# Patient Record
Sex: Male | Born: 1960 | Race: White | Hispanic: No | Marital: Married
Health system: Southern US, Community
[De-identification: ages and names within clinical notes are randomized; demographics above are authoritative.]

## PROBLEM LIST (undated history)

## (undated) DIAGNOSIS — E785 Hyperlipidemia, unspecified: Secondary | ICD-10-CM

## (undated) DIAGNOSIS — N400 Enlarged prostate without lower urinary tract symptoms: Secondary | ICD-10-CM

## (undated) HISTORY — PX: CHOLECYSTECTOMY: SHX55

---

## 2010-03-18 ENCOUNTER — Ambulatory Visit: Payer: Self-pay | Admitting: Surgery

## 2010-03-25 ENCOUNTER — Ambulatory Visit: Payer: Self-pay | Admitting: Surgery

## 2013-07-03 ENCOUNTER — Ambulatory Visit: Payer: Self-pay | Admitting: Internal Medicine

## 2014-02-26 IMAGING — US ABDOMEN ULTRASOUND LIMITED
1 series · 14 of 25 positions shown · non-contrast
Comparison: none

REASON FOR EXAM: CALL REPORT PAGER [phone] DR JOHANNES PAULUS AFTER 5PM PAGE DR
MARTINI [phone] AT...
COMMENTS:

PROCEDURE:     US  - US ABDOMEN LIMITED SURVEY  - July 03, 2013  [DATE]
RESULT:     Right upper quadrant abdominal ultrasound dated 07/03/2013

[Series 1: abdomen ultrasound limited · 0.31mm/px · 14 of 26 slices shown]
[im 1/26]
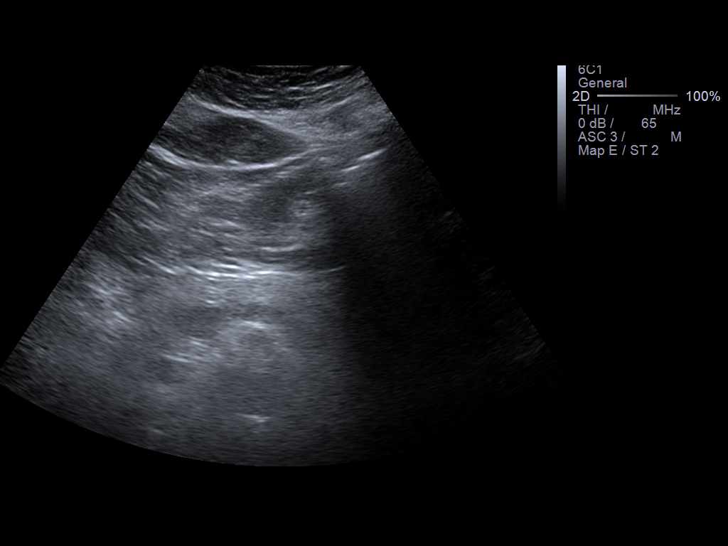
[im 3/26]
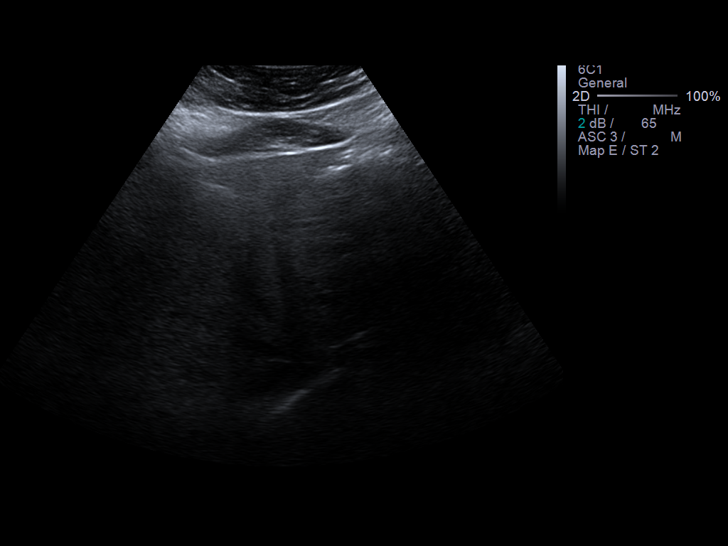
[im 5/26]
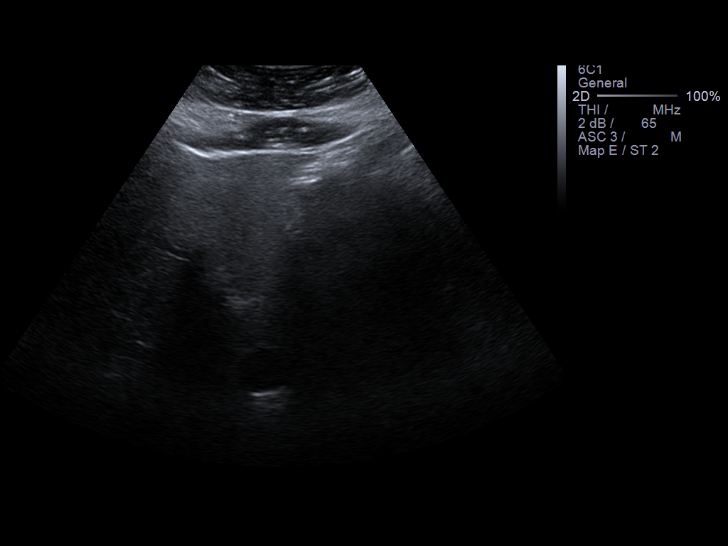
[im 7/26]
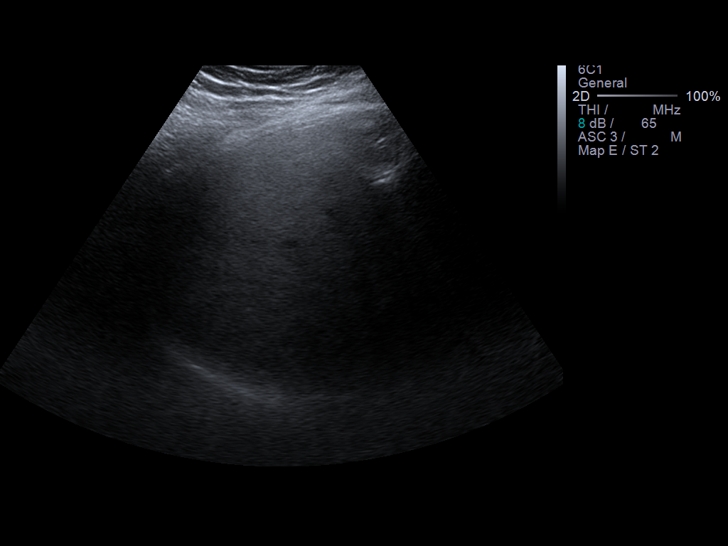
[im 9/26]
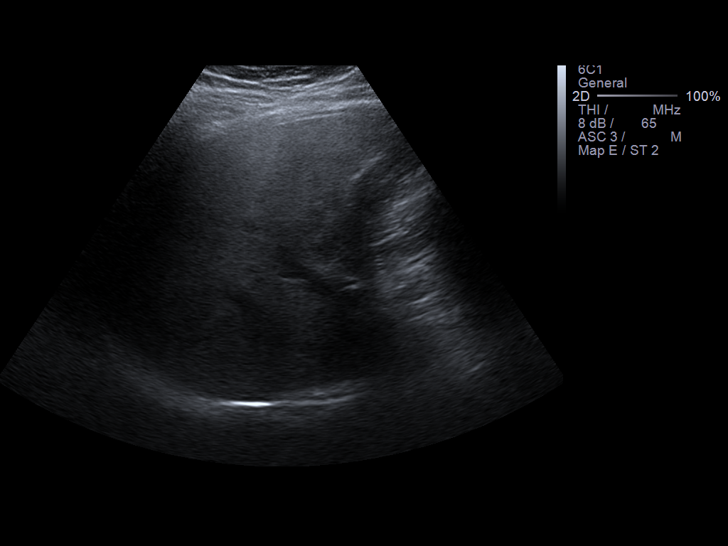
[im 10/26]
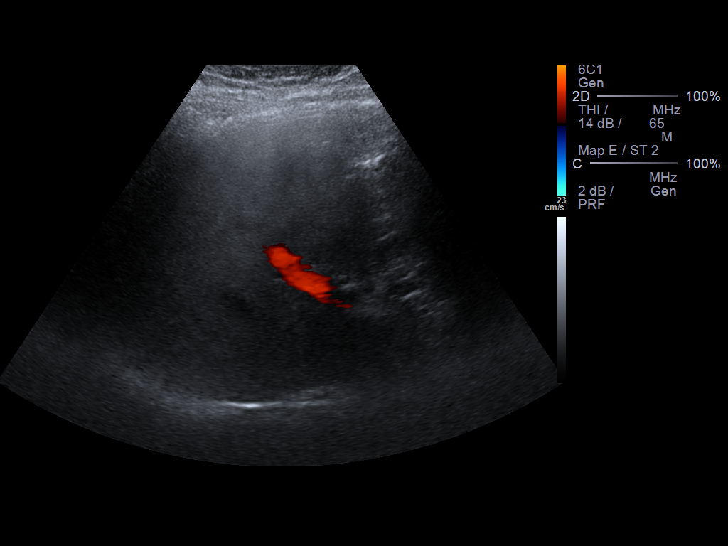
[im 12/26]
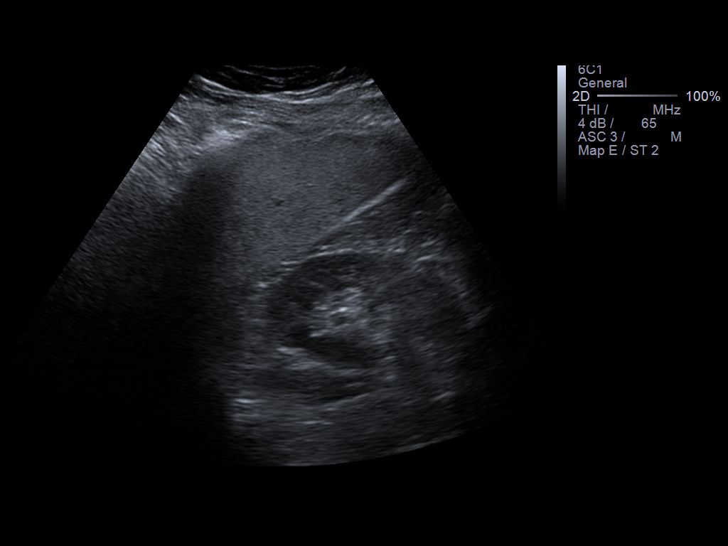
[im 14/26]
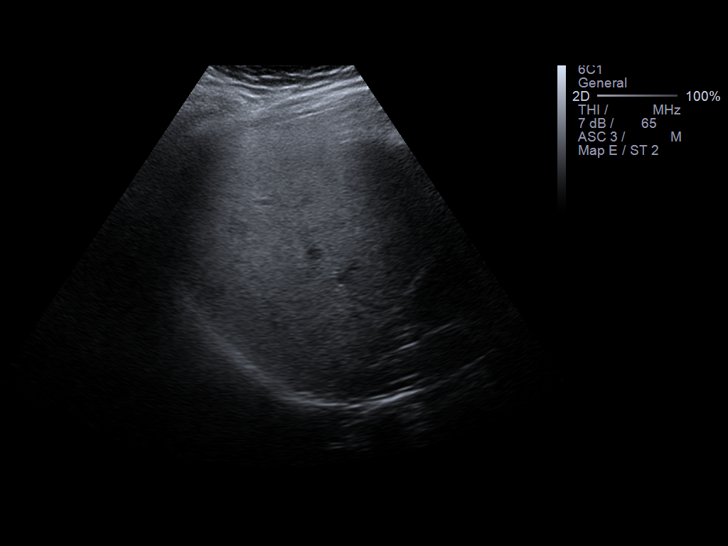
[im 16/26]
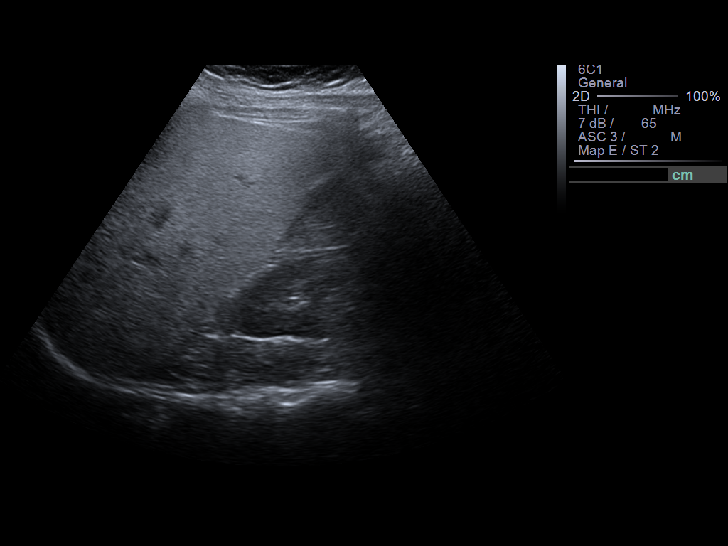
[im 17/26]
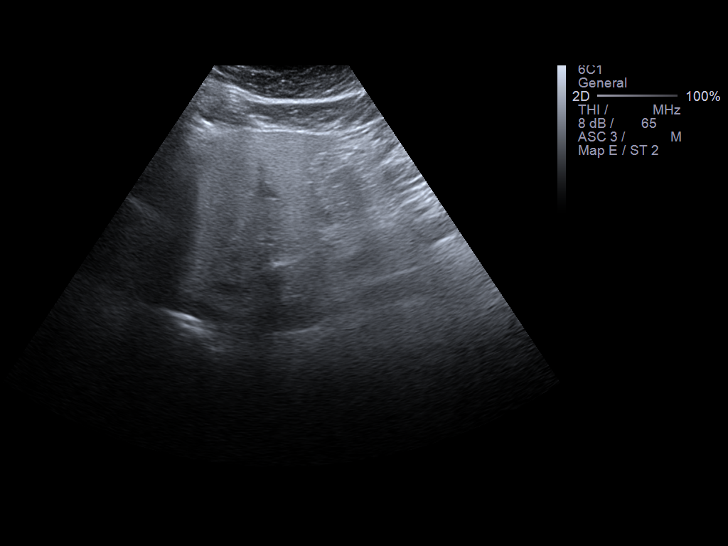
[im 19/26]
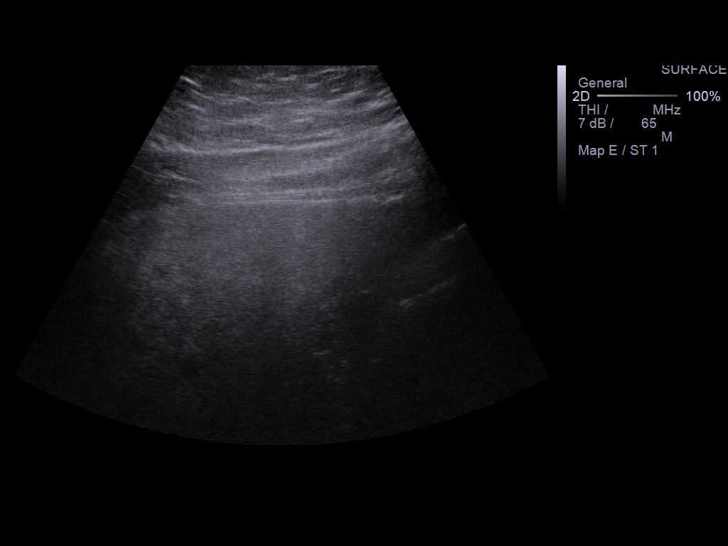
[im 21/26]
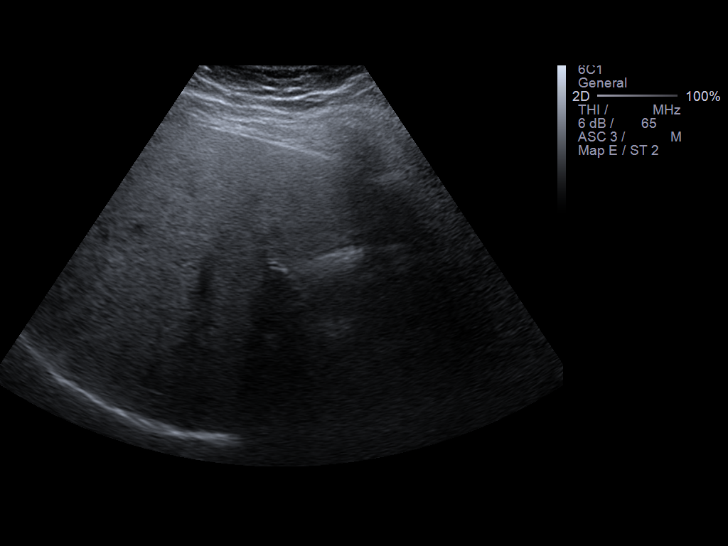
[im 23/26]
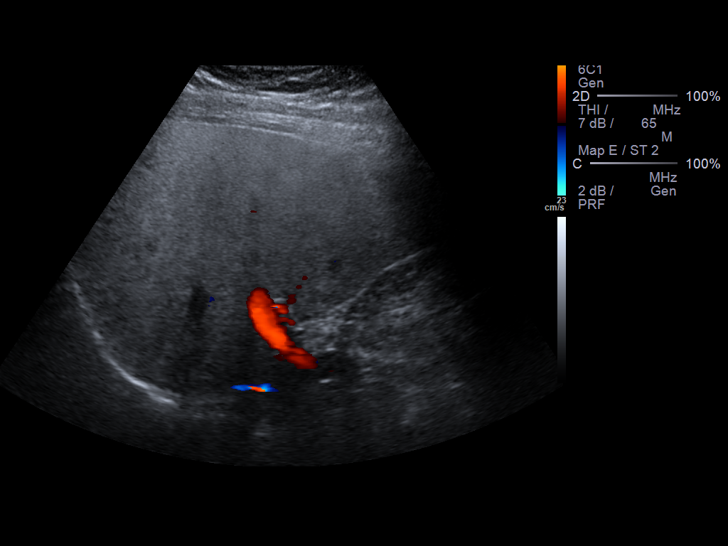
[im 26/26]
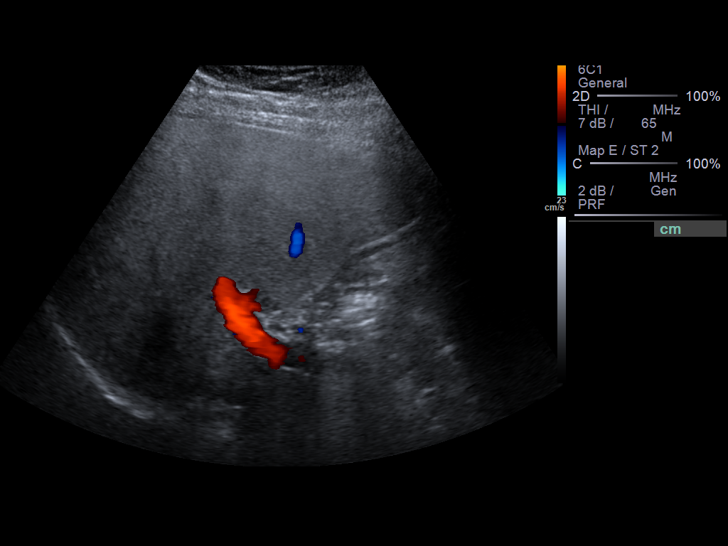

[14 of 25 positions shown; findings below may reference images not displayed]

FINDINGS: The liver demonstrates a dense echogenic architecture and measures
16.86 cm measured along the midclavicular line. Hepato- pedal flow is
identified within the portal vein. The pancreas is partially visualized is
grossly unremarkable. Visualization of pancreas is limited due to overlying
bowel gas. Patient status post cholecystectomy. There is no evidence of free
fluid loculated fluid collection in the gallbladder fossa. The common bile
duct measures 3.1 mm in diameter. There is no evidence of intrahepatic nor
extra hepatic biliary ductal dilatation.
IMPRESSION: Findings consistent with hepatic steatosis otherwise no
evidence of focal acute abnormalities.

## 2016-07-28 ENCOUNTER — Encounter: Payer: Self-pay | Admitting: *Deleted

## 2016-07-31 ENCOUNTER — Ambulatory Visit: Payer: BLUE CROSS/BLUE SHIELD | Admitting: Anesthesiology

## 2016-07-31 ENCOUNTER — Encounter
Admission: RE | Disposition: A | Discharge status: Home / Self Care | Payer: Self-pay | Source: Ambulatory Visit | Attending: Unknown Physician Specialty

## 2016-07-31 ENCOUNTER — Ambulatory Visit
Admission: RE | Admit: 2016-07-31 | Discharge: 2016-07-31 | Disposition: A | Discharge status: Home / Self Care | Payer: BLUE CROSS/BLUE SHIELD | Source: Ambulatory Visit | Attending: Unknown Physician Specialty | Admitting: Unknown Physician Specialty

## 2016-07-31 DIAGNOSIS — K640 First degree hemorrhoids: Secondary | ICD-10-CM | POA: Diagnosis not present

## 2016-07-31 DIAGNOSIS — E785 Hyperlipidemia, unspecified: Secondary | ICD-10-CM | POA: Diagnosis not present

## 2016-07-31 DIAGNOSIS — N400 Enlarged prostate without lower urinary tract symptoms: Secondary | ICD-10-CM | POA: Insufficient documentation

## 2016-07-31 DIAGNOSIS — Z1211 Encounter for screening for malignant neoplasm of colon: Secondary | ICD-10-CM | POA: Diagnosis not present

## 2016-07-31 DIAGNOSIS — D122 Benign neoplasm of ascending colon: Secondary | ICD-10-CM | POA: Diagnosis not present

## 2016-07-31 HISTORY — DX: Hyperlipidemia, unspecified: E78.5

## 2016-07-31 HISTORY — PX: COLONOSCOPY WITH PROPOFOL: SHX5780

## 2016-07-31 HISTORY — DX: Benign prostatic hyperplasia without lower urinary tract symptoms: N40.0

## 2016-07-31 SURGERY — COLONOSCOPY WITH PROPOFOL
Anesthesia: General

## 2016-07-31 MED ORDER — PROPOFOL 500 MG/50ML IV EMUL
INTRAVENOUS | Status: DC | PRN
  Administered 2016-07-31: 120 ug/kg/min via INTRAVENOUS

## 2016-07-31 MED ORDER — PROPOFOL 10 MG/ML IV BOLUS
INTRAVENOUS | Status: DC | PRN
  Administered 2016-07-31: 70 mg via INTRAVENOUS

## 2016-07-31 MED ORDER — SODIUM CHLORIDE 0.9 % IV SOLN
INTRAVENOUS | Status: DC
  Administered 2016-07-31: 1000 mL via INTRAVENOUS

## 2016-07-31 NOTE — Anesthesia Preprocedure Evaluation (Signed)
Anesthesia Evaluation  Patient identified by MRN, date of birth, ID band Patient awake    Reviewed: Allergy & Precautions, NPO status , Patient's Chart, lab work & pertinent test results  History of Anesthesia Complications Negative for: history of anesthetic complications  Airway Mallampati: II  TM Distance: >3 FB Neck ROM: Full    Dental no notable dental hx.    Pulmonary neg pulmonary ROS, neg sleep apnea, neg COPD, neg PE   breath sounds clear to auscultation- rhonchi (-) wheezing      Cardiovascular Exercise Tolerance: Good (-) hypertension(-) CAD and (-) Past MI  Rhythm:Regular Rate:Normal - Systolic murmurs and - Diastolic murmurs    Neuro/Psych negative neurological ROS  negative psych ROS   GI/Hepatic negative GI ROS, Neg liver ROS,   Endo/Other  negative endocrine ROSneg diabetes  Renal/GU negative Renal ROS     Musculoskeletal   Abdominal (+) - obese,   Peds  Hematology negative hematology ROS (+)   Anesthesia Other Findings Past Medical History: No date: BPH (benign prostatic hyperplasia) No date: Hyperlipidemia   Reproductive/Obstetrics                             Anesthesia Physical Anesthesia Plan  ASA: II  Anesthesia Plan: General   Post-op Pain Management:    Induction: Intravenous  Airway Management Planned: Natural Airway  Additional Equipment:   Intra-op Plan:   Post-operative Plan:   Informed Consent: I have reviewed the patients History and Physical, chart, labs and discussed the procedure including the risks, benefits and alternatives for the proposed anesthesia with the patient or authorized representative who has indicated his/her understanding and acceptance.   Dental advisory given  Plan Discussed with: CRNA and Anesthesiologist  Anesthesia Plan Comments:         Anesthesia Quick Evaluation

## 2016-07-31 NOTE — Anesthesia Postprocedure Evaluation (Signed)
Anesthesia Post Note  Patient: Francisco Patton  Procedure(s) Performed: Procedure(s) (LRB): COLONOSCOPY WITH PROPOFOL (N/A)  Patient location during evaluation: Endoscopy Anesthesia Type: General Level of consciousness: awake and alert and oriented Pain management: pain level controlled Vital Signs Assessment: post-procedure vital signs reviewed and stable Respiratory status: spontaneous breathing, nonlabored ventilation and respiratory function stable Cardiovascular status: blood pressure returned to baseline and stable Postop Assessment: no signs of nausea or vomiting Anesthetic complications: no    Last Vitals:  Vitals:   07/31/16 1212 07/31/16 1222  BP: 116/79 118/84  Pulse: (!) 52 (!) 51  Resp: 18 20  Temp:      Last Pain:  Vitals:   07/31/16 1152  TempSrc: Tympanic                 [name]

## 2016-07-31 NOTE — H&P (Signed)
   Primary Care Physician:  Adin Hector, MD Primary Gastroenterologist:  Dr. Vira Agar  Pre-Procedure History & Physical: HPI:  Francisco Patton is a 55 y.o. male is here for an colonoscopy.   Past Medical History:  Diagnosis Date  . BPH (benign prostatic hyperplasia)   . Hyperlipidemia     Past Surgical History:  Procedure Laterality Date  . CHOLECYSTECTOMY      Prior to Admission medications   Medication Sig Start Date End Date Taking? Authorizing Provider  finasteride (PROSCAR) 5 MG tablet Take 5 mg by mouth daily.   Yes Historical Provider, MD  tadalafil (CIALIS) 20 MG tablet Take 20 mg by mouth daily as needed for erectile dysfunction.   Yes Historical Provider, MD    Allergies as of 07/12/2016  . (Not on File)    No family history on file.  Social History   Social History  . Marital status: Married    Spouse name: N/A  . Number of children: N/A  . Years of education: N/A   Occupational History  . Not on file.   Social History Main Topics  . Smoking status: Not on file  . Smokeless tobacco: Not on file  . Alcohol use Not on file  . Drug use: Unknown  . Sexual activity: Not on file   Other Topics Concern  . Not on file   Social History Narrative  . No narrative on file    Review of Systems: See HPI, otherwise negative ROS  Physical Exam: BP (!) 158/83   Pulse (!) 59   Temp 97.6 F (36.4 C) (Tympanic)   Resp 16   Ht 6\' 3"  (1.905 m)   Wt 106.1 kg (234 lb)   SpO2 100%   BMI 29.25 kg/m  General:   Alert,  pleasant and cooperative in NAD Head:  Normocephalic and atraumatic. Neck:  Supple; no masses or thyromegaly. Lungs:  Clear throughout to auscultation.    Heart:  Regular rate and rhythm. Abdomen:  Soft, nontender and nondistended. Normal bowel sounds, without guarding, and without rebound.   Neurologic:  Alert and  oriented x4;  grossly normal neurologically.  Impression/Plan: Francisco Patton is here for an colonoscopy to be performed  for screening  Risks, benefits, limitations, and alternatives regarding  colonoscopy have been reviewed with the patient.  Questions have been answered.  All parties agreeable.   Gaylyn Cheers, MD  07/31/2016, 11:21 AM

## 2016-07-31 NOTE — Op Note (Signed)
[facility] Gastroenterology Patient Name: Francisco Patton Procedure Date: 07/31/2016 11:17 AM MRN: [id number] Account #: [id number] Date of Birth: December 31, 1960 Admit Type: Outpatient Age: 55 Room: [facility] ENDO ROOM 4 Gender: Male Note Status: Finalized Procedure:            Colonoscopy Indications:          Screening for colorectal malignant neoplasm Providers:            Manya Silvas, MD Referring MD:         Ramonita Lab, MD (Referring MD) Medicines:            Propofol per Anesthesia Complications:        No immediate complications. Procedure:            Pre-Anesthesia Assessment:                       - After reviewing the risks and benefits, the patient                        was deemed in satisfactory condition to undergo the                        procedure.                       After obtaining informed consent, the colonoscope was                        passed under direct vision. Throughout the procedure,                        the patient's blood pressure, pulse, and oxygen                        saturations were monitored continuously. The                        Colonoscope was introduced through the anus and                        advanced to the the cecum, identified by appendiceal                        orifice and ileocecal valve. The colonoscopy was                        performed without difficulty. The patient tolerated the                        procedure well. The quality of the bowel preparation                        was excellent. Findings:      Two sessile polyps were found in the ascending colon. The polyps were       small in size. These polyps were removed with a hot snare. Resection and       retrieval were complete. To prevent bleeding after the polypectomy, two       hemostatic clips were successfully placed. There was no bleeding at the       end of the procedure.  Internal hemorrhoids were found during endoscopy. The  hemorrhoids were       small and Grade I (internal hemorrhoids that do not prolapse).      The exam was otherwise without abnormality. Impression:           - Two small polyps in the ascending colon, removed with                        a hot snare. Resected and retrieved. Clips were placed.                       - Internal hemorrhoids.                       - The examination was otherwise normal. Recommendation:       - Await pathology results. Manya Silvas, MD 07/31/2016 11:53:17 AM This report has been signed electronically. Number of Addenda: 0 Note Initiated On: 07/31/2016 11:17 AM Scope Withdrawal Time: 0 hours 12 minutes 11 seconds  Total Procedure Duration: 0 hours 23 minutes 25 seconds       [facility]

## 2016-07-31 NOTE — Transfer of Care (Signed)
Immediate Anesthesia Transfer of Care Note  Patient: Francisco Patton  Procedure(s) Performed: Procedure(s): COLONOSCOPY WITH PROPOFOL (N/A)  Patient Location: PACU  Anesthesia Type:General  Level of Consciousness: awake, alert  and oriented  Airway & Oxygen Therapy: Patient Spontanous Breathing and Patient connected to nasal cannula oxygen  Post-op Assessment: Report given to RN and Post -op Vital signs reviewed and stable  Post vital signs: Reviewed and stable  Last Vitals:  Vitals:   07/31/16 1032 07/31/16 1152  BP: (!) 158/83 (!) (P) 89/43  Pulse: (!) 59   Resp: 16 (P) 11  Temp: 36.4 C (P) 36.2 C    Last Pain:  Vitals:   07/31/16 1152  TempSrc: (P) Tympanic         Complications: No apparent anesthesia complications

## 2016-08-01 ENCOUNTER — Encounter: Payer: Self-pay | Admitting: Unknown Physician Specialty

## 2016-08-01 LAB — SURGICAL PATHOLOGY
